# Patient Record
Sex: Male | Born: 1965 | Race: White | Hispanic: No | Marital: Married | Smoking: Current every day smoker
Health system: Southern US, Community
[De-identification: ages and names within clinical notes are randomized; demographics above are authoritative.]

## PROBLEM LIST (undated history)

## (undated) ENCOUNTER — Emergency Department (HOSPITAL_COMMUNITY): Disposition: A | Discharge status: Home / Self Care | Payer: Self-pay

## (undated) DIAGNOSIS — I10 Essential (primary) hypertension: Secondary | ICD-10-CM

## (undated) DIAGNOSIS — E785 Hyperlipidemia, unspecified: Secondary | ICD-10-CM

## (undated) DIAGNOSIS — L408 Other psoriasis: Secondary | ICD-10-CM

## (undated) HISTORY — DX: Essential (primary) hypertension: I10

## (undated) HISTORY — PX: HEMORRHOID SURGERY: SHX153

## (undated) HISTORY — DX: Other psoriasis: L40.8

## (undated) HISTORY — DX: Hyperlipidemia, unspecified: E78.5

---

## 2008-05-19 ENCOUNTER — Emergency Department (HOSPITAL_COMMUNITY): Admission: EM | Admit: 2008-05-19 | Discharge: 2008-05-20 | Payer: Self-pay | Admitting: Emergency Medicine

## 2009-11-19 IMAGING — CT CT NECK W/ CM
1 series · 12 of 14 positions shown, 15 images · IV contrast (agent unspecified)
Comparison: No priors

Addendum Begins

Note that the impression should have included the statement that to
there is also an abscess in the region of the right piriform sinus
SIGNED BY: Kervin Bibiano, M.D.
Addendum Ends
CLINICAL DATA: Sore throat.  Dysphasia.
CT NECK WITH CONTRAST
TECHNIQUE: Multidetector CT imaging of the neck was performed with
intravenous contrast.
Contrast: 100 ml Fmnipaque-8II IV

[Series 2: neck_routine 3.0 b40s st · axial · 0.39mm/px · z∈[-396,-132]mm · 12 of 105 slices shown, 15 images]
[im 9/105  soft-tissue]
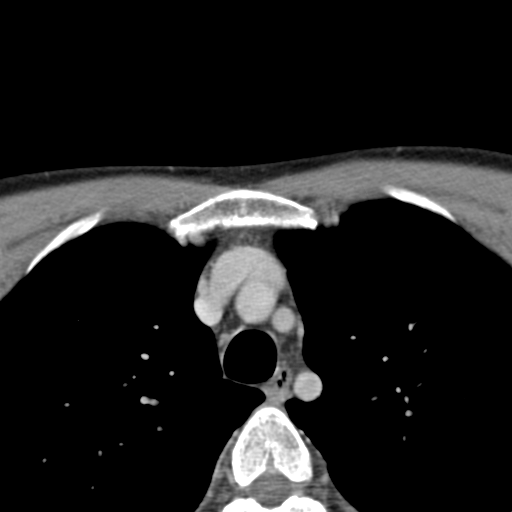
[im 9/105  bone]
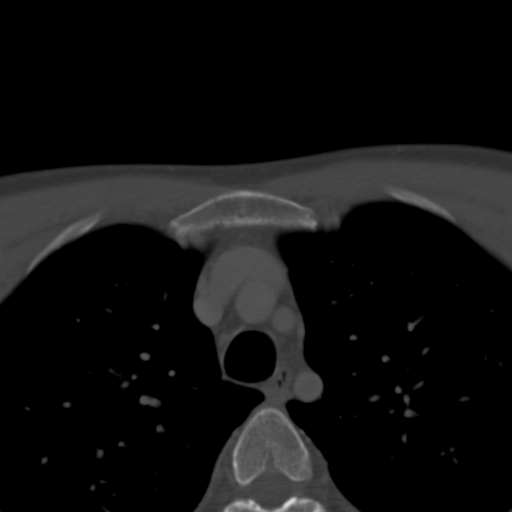
[im 17/105  bone]
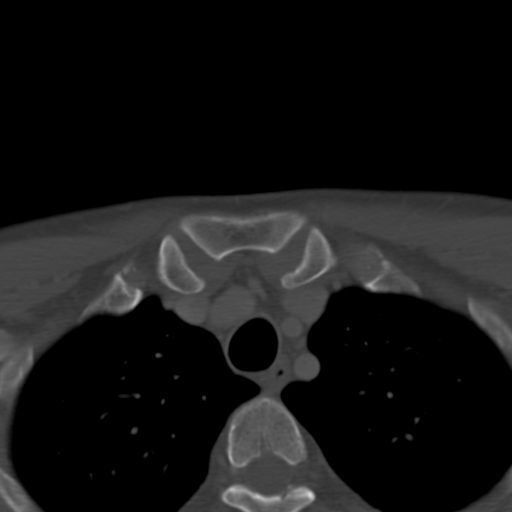
[im 25/105  bone]
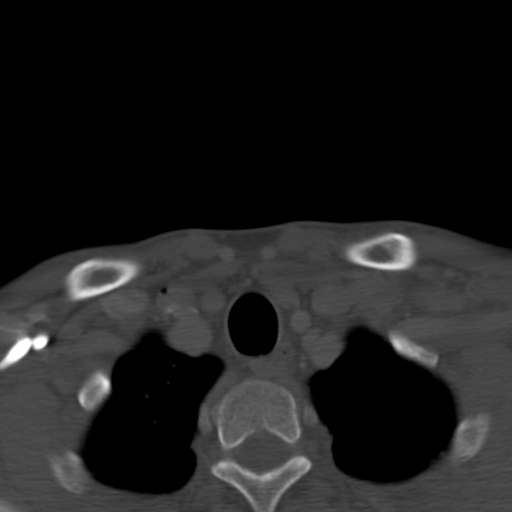
[im 33/105  bone]
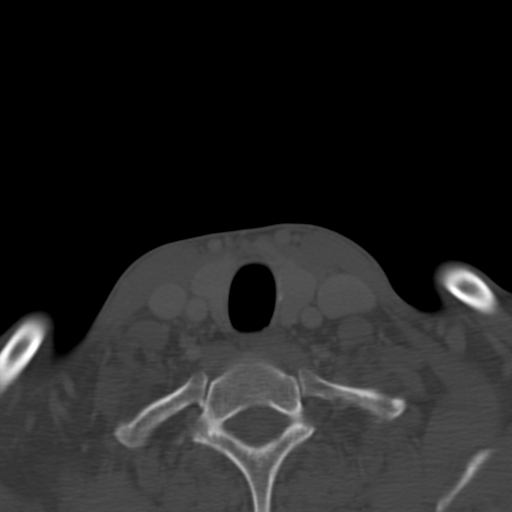
[im 41/105  soft-tissue]
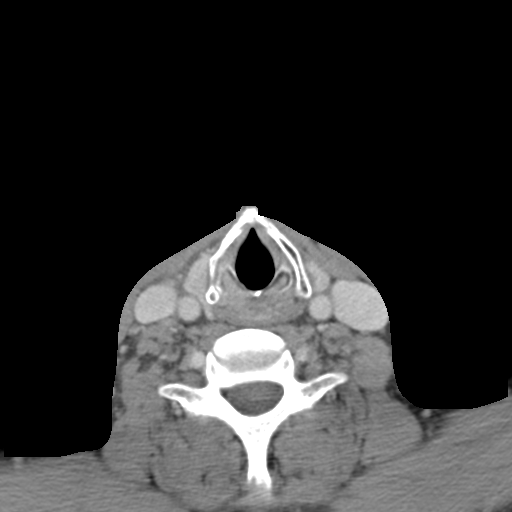
[im 41/105  bone]
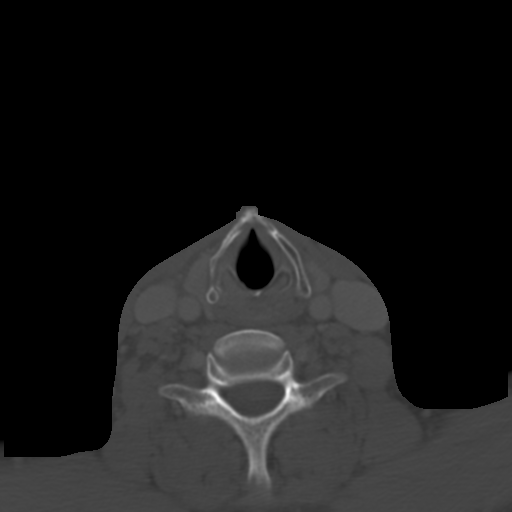
[im 49/105  bone]
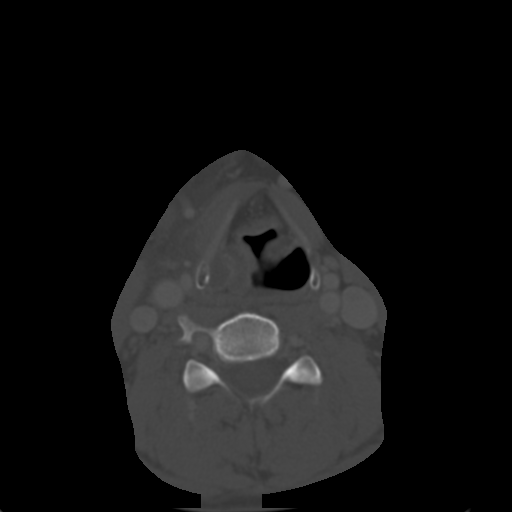
[im 57/105  bone]
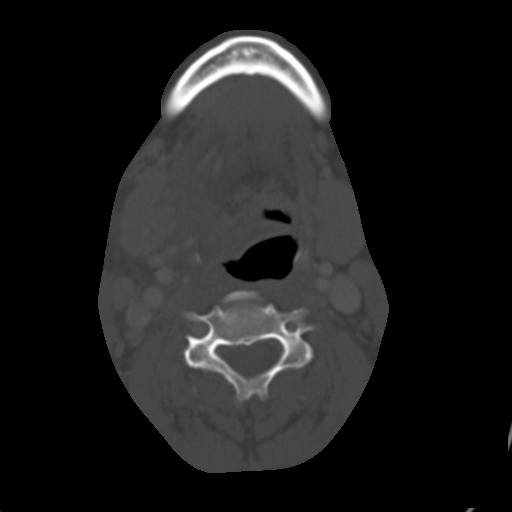
[im 65/105  bone]
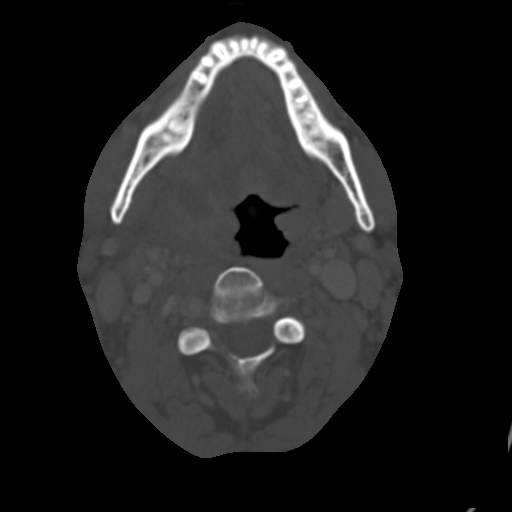
[im 73/105  soft-tissue]
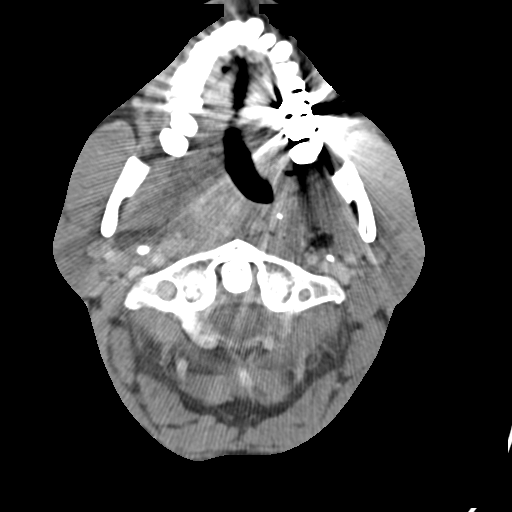
[im 73/105  bone]
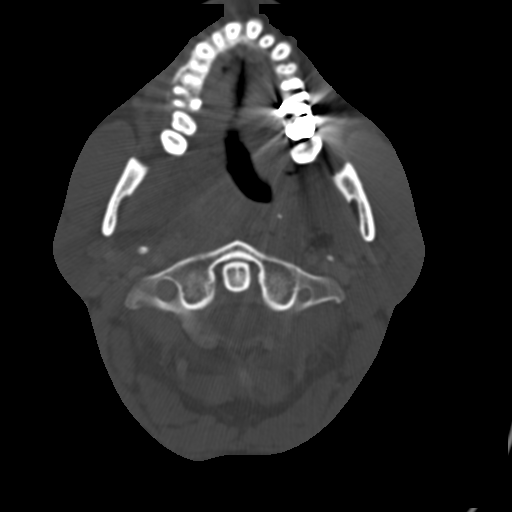
[im 81/105  bone]
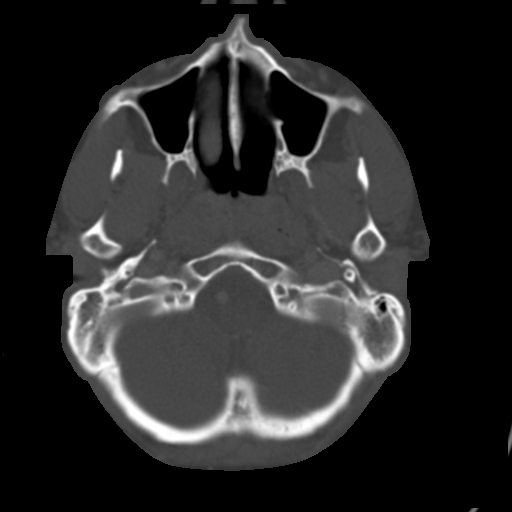
[im 89/105  bone]
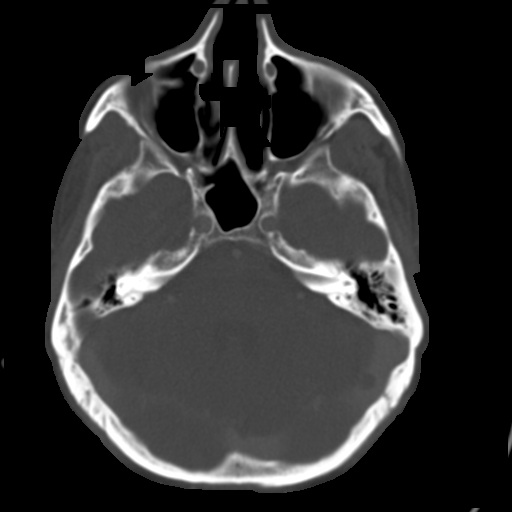
[im 97/105  bone]
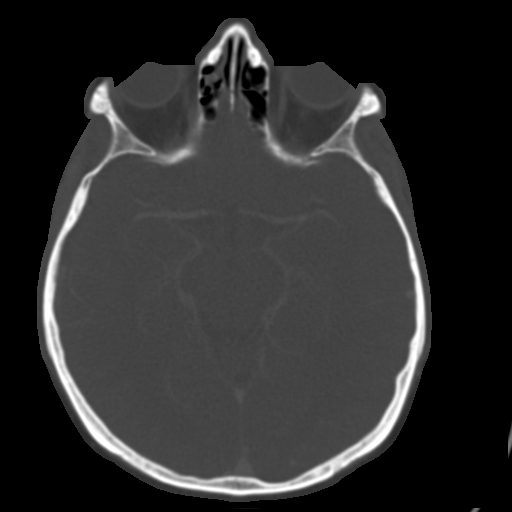

[12 of 14 positions shown; findings below may reference images not displayed]

FINDINGS: Findings compatible with extensive infection involving
the supraglottic neck structures including mainly the right aspect
of the posterior nasopharynx, the uvula, the oropharynx including
the tonsils with findings compatible with a 1.2 x 1.8 by 1.9 right
paratonsillar abscess.  The structures are quite edematous with
phlegmonous involvement.  There is involvement of the soft tissues
posterolateral to the right tongue base and extending into the
epiglottis and preepiglottic space.  Edema also involves the more
posterior soft tissues adjacent to the right carotid and jugular
vessels.  Edema appears to involve the aryepiglottic folds and
particularly about the right piriform sinus with an abscess within
and/or directly contiguous particularly with the lateral aspect of
the right piriform sinus.  The vocal cords do not appear directly
involved.  Thyroid gland unremarkable.  No pathologically enlarged
lymph nodes.
IMPRESSION: Findings compatible severe neck infection with extensive
involvement of the supraglottic structures.  Findings compatible
with pharyngitis and right paratonsillar abscess. Extensive
phlegmonos changes are noted.

Critical test results telephoned to [place], PA in the ER at the time
of interpretation on date 04/18/2008 at time 1513 hours. I stressed
the need for emergent ENT consultation.

## 2011-02-28 NOTE — Op Note (Signed)
NAME:  Todd Nash, Todd Nash NO.:  [id number]   MEDICAL RECORD NO.:  [id number]          PATIENT TYPE:  EMS   LOCATION:  ED                           FACILITY:  [facility]   PHYSICIAN:  Antony Contras, MD     DATE OF BIRTH:  07/04/1966   DATE OF PROCEDURE:  05/19/2008  DATE OF DISCHARGE:                               OPERATIVE REPORT   PREOPERATIVE DIAGNOSIS:  Right peritonsillar abscess.   POSTOPERATIVE DIAGNOSIS:  Right peritonsillar abscess.   PROCEDURE:  Incision and drainage of right peritonsillar abscess.   SURGEON:  Antony Contras, M.D.   ANESTHESIA:  Local.   COMPLICATIONS:  None.   INDICATIONS FOR PROCEDURE:  The patient is a 45 year old white male who  developed sore throat on the right side 2 days ago that has progressed  inspite of antibiotic therapy.  A CT scan and examination demonstrated a  right peritonsillar abscess.   FINDINGS:  The abscess was able to be entered through a peritonsillar  incision and was found to be lateral and low on the right side.  Yellow  pus was drained.   DESCRIPTION OF PROCEDURE:  The patient was identified in the emergency  department and informed consent was obtained including a discussion of  risks, benefits, and alternatives.  The patient was placed in a seated  position and the right oropharynx was sprayed with Cetacaine twice.  One  percent lidocaine with 1:100,000 epinephrine was then injected in the  right oropharynx in the peritonsillar region.  A horizontal incision was  made above the right tonsil using an 11 blade scalpel and dissection was  then performed using Hemostat into the peritonsillar region.  No abscess  was encountered.  A 22 gauge needle on a 10 mL syringe was then used to  try to find the abscess in various places in the right peritonsillar  region.  The skin was reexamined a couple of times during the attempt.  Ultimately the incision was extended laterally and inferiorly along the  tonsillar  border and the abscess was finally encountered inferior and  laterally.  Yellow pus drained readily.  After this, the patient was  returned to emergency room care in stable condition.   RECOMMENDATIONS:  After receiving intravenous fluids, Clindamycin, and  Decadron in the emergency department, he will be discharged home on oral  Clindamycin 300 mg four times daily for 7 days and prednisone 50 mg once  daily for 3 days.  Follow-up will be in my office in 1 week.      Antony Contras, MD  Electronically Signed     DDB/MEDQ  D:  05/19/2008  T:  05/20/2008  Job:  [id number]

## 2011-02-28 NOTE — Consult Note (Signed)
NAME:  Todd Nash, Todd Nash NO.:  [id number]   MEDICAL RECORD NO.:  [id number]          PATIENT TYPE:  EMS   LOCATION:  ED                           FACILITY:  [facility]   PHYSICIAN:  Antony Contras, MD     DATE OF BIRTH:  27-Sep-1966   DATE OF CONSULTATION:  05/19/2008  DATE OF DISCHARGE:                                 CONSULTATION   CHIEF COMPLAINT:  Right peritonsillar abscess.   HISTORY OF PRESENT ILLNESS:  The patient is a 45 year old white male who  developed sore throat 2 days ago that is primarily on the right side.  He went to Urgent Care yesterday where a Strep test was negative and he  was prescribed Omnicef.  It worsened through the day today and has been  associated with a low grade fever of 100.2 degrees.  He has been unable  to swallow due to pain and has a hot potato voice.  The pain is 10/10  and is on the right side and radiates to the right ear.   PAST MEDICAL HISTORY:  None.   MEDICATIONS:  Omnicef.   ALLERGIES:  No known drug allergies.   FAMILY HISTORY:  None.   SOCIAL HISTORY:  The patient smokes a pack of cigarettes per day and has  for 26 years.  He is an occasional drinker and denies drug use.   REVIEW OF SYSTEMS:  Negative except as listed above.   PHYSICAL EXAMINATION:  VITAL SIGNS:  Temperature 99.7, blood pressure  142/91, pulse 100, respirations 22.  GENERAL:  The patient is in no acute distress, but appears  uncomfortable.  He is alert and oriented.  HEENT:  Voice; the patient has a hot potato voice.  Ears; external ears  are normal and external canals are without significant cerumen.  Tympanic membranes are intact.  Middle ear spaces are aerated.  Nose;  external nose is normal.  Nasal passages are patent.  Oral cavity,  oropharynx; the patient has mild trismus.  He has bad breath and dry  mucous membranes.  The lips and teeth are normal as is the tongue and  floor of mouth.  The right oropharynx is significant for marked  peritonsillar edema on the right side pushing the uvula toward the left  with a swollen uvula.  NECK:  The patient is tender in zone 2 on the right.  The remainder of  the neck is without tenderness or deformity.  Cranial nerves II-XII  grossly intact.  Salivary glands normal to palpation.  Thyroid is normal  to palpation.  LYMPHATICS:  There are no enlarged cervical lymph nodes.   RADIOLOGIC EXAMINATION:  A CT of the neck was personally reviewed and  demonstrates a fluid collection in the right peritonsillar region with  surrounding tissue enhancement and edema.   ASSESSMENT:  The patient is a 45 year old white male with a right  peritonsillar abscess.   PLAN:  The abscess will be drained in the emergency department under  local anesthetic.  The patient has been given intravenous Clindamycin  and will be given intravenous Decadron as well.  He should  be able to be  discharged on oral antibiotics and oral steroids.      Antony Contras, MD  Electronically Signed     DDB/MEDQ  D:  05/19/2008  T:  05/20/2008  Job:  [id number]

## 2021-12-19 ENCOUNTER — Telehealth: Payer: Self-pay | Admitting: Internal Medicine

## 2021-12-19 NOTE — Telephone Encounter (Signed)
Patient called states he had a cologuard test done and it came out positive. I offered to schedule him but he requested to speak with you first.  ? ?Michela Pitcher he was a friend. ? ?Thanks ?

## 2021-12-19 NOTE — Telephone Encounter (Signed)
I spoke to this patient.  Acquaintance from the past.  He needs an office appointment with me. ?Please offer him either this Thursday, March 8 at 11:40 AM (would be an add-on); or March 29 at 2 PM. ?Thanks, ?Dr. Henrene Pastor ?

## 2021-12-20 ENCOUNTER — Encounter: Payer: Self-pay | Admitting: Internal Medicine

## 2022-01-30 ENCOUNTER — Ambulatory Visit: Payer: No Typology Code available for payment source | Admitting: Internal Medicine

## 2022-01-30 ENCOUNTER — Encounter: Payer: Self-pay | Admitting: Internal Medicine

## 2022-01-30 VITALS — BP 138/58 | HR 64 | Ht 73.0 in | Wt 150.0 lb

## 2022-01-30 DIAGNOSIS — R195 Other fecal abnormalities: Secondary | ICD-10-CM

## 2022-01-30 MED ORDER — PLENVU 140 G PO SOLR: 1.0000 | Freq: Once | ORAL | 0 refills | Status: AC

## 2022-01-30 NOTE — Patient Instructions (Signed)
If you are age 55 or older, your body mass index should be between 23-30. Your Body mass index is 19.79 kg/m?Marland Kitchen If this is out of the aforementioned range listed, please consider follow up with your Primary Care Provider. ? ?If you are age 45 or younger, your body mass index should be between 19-25. Your Body mass index is 19.79 kg/m?Marland Kitchen If this is out of the aformentioned range listed, please consider follow up with your Primary Care Provider.  ? ?________________________________________________________ ? ?The [facility] providers would like to encourage you to use [facility] to communicate with providers for non-urgent requests or questions.  Due to long hold times on the telephone, sending your provider a message by [facility] may be a faster and more efficient way to get a response.  Please allow 48 business hours for a response.  Please remember that this is for non-urgent requests.  ?_______________________________________________________ ?You have been scheduled for a colonoscopy. Please follow written instructions given to you at your visit today.  ?Please pick up your prep supplies at the pharmacy within the next 1-3 days. ?If you use inhalers (even only as needed), please bring them with you on the day of your procedure. ?  ?

## 2022-01-30 NOTE — Progress Notes (Signed)
HISTORY OF PRESENT ILLNESS: ? ?Todd Nash is a 56 y.o. 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  Patient has not had prior GI evaluation.  His GI review of systems is entirely negative.  His father had a history of GERD.  No family history of GI malignancy.  Records from his PCP Cathlean Marseilles, NP have been reviewed.  Blood work from November 21, 2021 shows a normal comprehensive metabolic panel.  CBC reveals mild anemia with hemoglobin 11.1.  MCV 96.  Cologuard testing positive ? ?REVIEW OF SYSTEMS: ? ?All non-GI ROS negative unless otherwise stated in the HPI. ?Past Medical History:  ?Diagnosis Date  ? Hyperlipidemia   ? Hypertension   ? Psoriasiform seborrheic dermatitis   ? ? ?Past Surgical History:  ?Procedure Laterality Date  ? HEMORRHOID SURGERY    ? ? ?Social History ?Todd Nash  reports that he has been smoking cigarettes. He has never used smokeless tobacco. No history on file for alcohol use and drug use. ? ?family history includes Heart attack in his father; Polycystic kidney disease in his father; Ulcers in his father. ? ?No Known Allergies ? ?  ? ?PHYSICAL EXAMINATION: ?Vital signs: BP (!) 138/58   Pulse 64   Ht '6\' 1"'$  (1.854 m)   Wt 150 lb (68 kg)   BMI 19.79 kg/m?   ?Constitutional: generally well-appearing, no acute distress ?Psychiatric: alert and oriented x3, cooperative ?Eyes: extraocular movements intact, anicteric, conjunctiva pink ?Mouth: oral pharynx moist, no lesions ?Neck: supple no lymphadenopathy ?Cardiovascular: heart regular rate and rhythm, no murmur ?Lungs: clear to auscultation bilaterally ?Abdomen: soft, nontender, nondistended, no obvious ascites, no peritoneal signs, normal bowel sounds, no organomegaly ?Rectal: Deferred until colonoscopy ?Extremities: no clubbing, cyanosis,  or lower extremity edema bilaterally ?Skin: no lesions on visible extremities ?Neuro: No focal deficits.  Cranial nerves intact ? ?ASSESSMENT: ? ?1.  Positive Cologuard testing.  Rule out neoplasia ?2.  Mild normocytic anemia ?3.  General medical problems.  Stable ?4.  Chronic smoker ? ? ?PLAN: ? ?1.  Colonoscopy.The nature of the procedure, as well as the risks, benefits, and alternatives were carefully and thoroughly reviewed with the patient. Ample time for discussion and questions allowed. The patient understood, was satisfied, and agreed to proceed. ?2.  Stop smoking ? ? ? ? ?  ?

## 2022-02-20 ENCOUNTER — Encounter: Payer: Self-pay | Admitting: Internal Medicine

## 2022-02-27 ENCOUNTER — Encounter: Payer: Self-pay | Admitting: Internal Medicine

## 2022-02-27 ENCOUNTER — Ambulatory Visit (AMBULATORY_SURGERY_CENTER): Payer: No Typology Code available for payment source | Admitting: Internal Medicine

## 2022-02-27 VITALS — BP 133/88 | HR 67 | Temp 97.8°F | Resp 13 | Ht 73.0 in | Wt 150.0 lb

## 2022-02-27 DIAGNOSIS — D124 Benign neoplasm of descending colon: Secondary | ICD-10-CM | POA: Diagnosis not present

## 2022-02-27 DIAGNOSIS — D122 Benign neoplasm of ascending colon: Secondary | ICD-10-CM

## 2022-02-27 DIAGNOSIS — D123 Benign neoplasm of transverse colon: Secondary | ICD-10-CM

## 2022-02-27 DIAGNOSIS — D125 Benign neoplasm of sigmoid colon: Secondary | ICD-10-CM | POA: Diagnosis not present

## 2022-02-27 DIAGNOSIS — K648 Other hemorrhoids: Secondary | ICD-10-CM

## 2022-02-27 DIAGNOSIS — R195 Other fecal abnormalities: Secondary | ICD-10-CM

## 2022-02-27 DIAGNOSIS — Z1211 Encounter for screening for malignant neoplasm of colon: Secondary | ICD-10-CM

## 2022-02-27 MED ORDER — SODIUM CHLORIDE 0.9 % IV SOLN: 500.0000 mL | Freq: Once | INTRAVENOUS | Status: DC

## 2022-02-27 NOTE — Op Note (Signed)
[facility] ?Patient Name: Todd Nash ?Procedure Date: 02/27/2022 2:38 PM ?MRN: [id number] ?Endoscopist: Docia Chuck. Henrene Pastor , MD ?Age: 56 ?Referring MD:  ?Date of Birth: 08/26/1966 ?Gender: Male ?Account #: [id number] ?Procedure:                Colonoscopy with cold snare polypectomy x 6 ?Indications:              Screening for colorectal malignant neoplasm.  ?                          Positive Cologuard testing. Index exam ?Medicines:                Monitored Anesthesia Care ?Procedure:                Pre-Anesthesia Assessment: ?                          - Prior to the procedure, a History and Physical  ?                          was performed, and patient medications and  ?                          allergies were reviewed. The patient's tolerance of  ?                          previous anesthesia was also reviewed. The risks  ?                          and benefits of the procedure and the sedation  ?                          options and risks were discussed with the patient.  ?                          All questions were answered, and informed consent  ?                          was obtained. Prior Anticoagulants: The patient has  ?                          taken no previous anticoagulant or antiplatelet  ?                          agents. ASA Grade Assessment: II - A patient with  ?                          mild systemic disease. After reviewing the risks  ?                          and benefits, the patient was deemed in  ?                          satisfactory condition to undergo the procedure. ?  After obtaining informed consent, the colonoscope  ?                          was passed under direct vision. Throughout the  ?                          procedure, the patient's blood pressure, pulse, and  ?                          oxygen saturations were monitored continuously. The  ?                          CF [id number] #[id number] was introduced through the anus  ?                           and advanced to the the cecum, identified by  ?                          appendiceal orifice and ileocecal valve. The  ?                          ileocecal valve, appendiceal orifice, and rectum  ?                          were photographed. The quality of the bowel  ?                          preparation was excellent. The colonoscopy was  ?                          performed without difficulty. The patient tolerated  ?                          the procedure well. The bowel preparation used was  ?                          SUPREP via split dose instruction. ?Scope In: 2:47:06 PM ?Scope Out: 3:09:05 PM ?Scope Withdrawal Time: 0 hours 19 minutes 9 seconds  ?Total Procedure Duration: 0 hours 21 minutes 59 seconds  ?Findings:                 Six polyps were found in the sigmoid colon,  ?                          descending colon, transverse colon and ascending  ?                          colon. The polyps were 2 to 5 mm in size. These  ?                          polyps were removed with a cold snare. Resection  ?                          and retrieval were  complete. ?                          Internal hemorrhoids were found during retroflexion. ?                          The exam was otherwise without abnormality on  ?                          direct and retroflexion views. ?Complications:            No immediate complications. Estimated blood loss:  ?                          None. ?Estimated Blood Loss:     Estimated blood loss: none. ?Impression:               - Six 2 to 5 mm polyps in the sigmoid colon, in the  ?                          descending colon, in the transverse colon and in  ?                          the ascending colon, removed with a cold snare.  ?                          Resected and retrieved. ?                          - Internal hemorrhoids. ?                          - The examination was otherwise normal on direct  ?                          and retroflexion views. ?Recommendation:            - Repeat colonoscopy in 3 years for surveillance. ?                          - Patient has a contact number available for  ?                          emergencies. The signs and symptoms of potential  ?                          delayed complications were discussed with the  ?                          patient. Return to normal activities tomorrow.  ?                          Written discharge instructions were provided to the  ?                          patient. ?                          -  Resume previous diet. ?                          - Continue present medications. ?                          - Await pathology results. ?Docia Chuck. Henrene Pastor, MD ?02/27/2022 3:24:53 PM ?This report has been signed electronically. ?

## 2022-02-27 NOTE — Progress Notes (Signed)
Pt's states no medical or surgical changes since previsit or office visit. 

## 2022-02-27 NOTE — Progress Notes (Signed)
HISTORY OF PRESENT ILLNESS: ?  ?Todd Nash is a 56 y.o. 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  Patient has not had prior GI evaluation.  His GI review of systems is entirely negative.  His father had a history of GERD.  No family history of GI malignancy.  Records from his PCP Cathlean Marseilles, NP have been reviewed.  Blood work from November 21, 2021 shows a normal comprehensive metabolic panel.  CBC reveals mild anemia with hemoglobin 11.1.  MCV 96.  Cologuard testing positive ?  ?REVIEW OF SYSTEMS: ?  ?All non-GI ROS negative unless otherwise stated in the HPI. ?    ?Past Medical History:  ?Diagnosis Date  ? Hyperlipidemia    ? Hypertension    ? Psoriasiform seborrheic dermatitis    ?  ?  ?     ?Past Surgical History:  ?Procedure Laterality Date  ? HEMORRHOID SURGERY      ?  ?  ?Social History ?Todd Nash  reports that he has been smoking cigarettes. He has never used smokeless tobacco. No history on file for alcohol use and drug use. ?  ?family history includes Heart attack in his father; Polycystic kidney disease in his father; Ulcers in his father. ?  ?No Known Allergies ?  ?  ?  ?PHYSICAL EXAMINATION: ?Vital signs: BP (!) 138/58   Pulse 64   Ht '6\' 1"'$  (1.854 m)   Wt 150 lb (68 kg)   BMI 19.79 kg/m?   ?Constitutional: generally well-appearing, no acute distress ?Psychiatric: alert and oriented x3, cooperative ?Eyes: extraocular movements intact, anicteric, conjunctiva pink ?Mouth: oral pharynx moist, no lesions ?Neck: supple no lymphadenopathy ?Cardiovascular: heart regular rate and rhythm, no murmur ?Lungs: clear to auscultation bilaterally ?Abdomen: soft, nontender, nondistended, no obvious ascites, no peritoneal signs, normal bowel sounds, no organomegaly ?Rectal: Deferred until  colonoscopy ?Extremities: no clubbing, cyanosis, or lower extremity edema bilaterally ?Skin: no lesions on visible extremities ?Neuro: No focal deficits.  Cranial nerves intact ?  ?ASSESSMENT: ?  ?1.  Positive Cologuard testing.  Rule out neoplasia ?2.  Mild normocytic anemia ?3.  General medical problems.  Stable ?4.  Chronic smoker ?  ?  ?PLAN: ?  ?1.  Colonoscopy.The nature of the procedure, as well as the risks, benefits, and alternatives were carefully and thoroughly reviewed with the patient. Ample time for discussion and questions allowed. The patient understood, was satisfied, and agreed to proceed. ?2.  Stop smoking ?  ?  ?  ?

## 2022-02-27 NOTE — Progress Notes (Signed)
Report to PACU, RN, vss, BBS= Clear.  

## 2022-02-27 NOTE — Progress Notes (Signed)
Called to room to assist during endoscopic procedure.  Patient ID and intended procedure confirmed with present staff. Received instructions for my participation in the procedure from the performing physician.  

## 2022-02-27 NOTE — Patient Instructions (Signed)
Handout on polyps and hemorrhoids given. ? ?Await pathology results. ? ?YOU HAD AN ENDOSCOPIC PROCEDURE TODAY AT [facility]:   Refer to the procedure report that was given to you for any specific questions about what was found during the examination.  If the procedure report does not answer your questions, please call your gastroenterologist to clarify.  If you requested that your care partner not be given the details of your procedure findings, then the procedure report has been included in a sealed envelope for you to review at your convenience later. ? ?YOU SHOULD EXPECT: Some feelings of bloating in the abdomen. Passage of more gas than usual.  Walking can help get rid of the air that was put into your GI tract during the procedure and reduce the bloating. If you had a lower endoscopy (such as a colonoscopy or flexible sigmoidoscopy) you may notice spotting of blood in your stool or on the toilet paper. If you underwent a bowel prep for your procedure, you may not have a normal bowel movement for a few days. ? ?Please Note:  You might notice some irritation and congestion in your nose or some drainage.  This is from the oxygen used during your procedure.  There is no need for concern and it should clear up in a day or so. ? ?SYMPTOMS TO REPORT IMMEDIATELY: ? ?Following lower endoscopy (colonoscopy or flexible sigmoidoscopy): ? Excessive amounts of blood in the stool ? Significant tenderness or worsening of abdominal pains ? Swelling of the abdomen that is new, acute ? Fever of 100?F or higher ? ?For urgent or emergent issues, a gastroenterologist can be reached at any hour by calling [phone]. ?Do not use MyChart messaging for urgent concerns.  ? ? ?DIET:  We do recommend a small meal at first, but then you may proceed to your regular diet.  Drink plenty of fluids but you should avoid alcoholic beverages for 24 hours. ? ?ACTIVITY:  You should plan to take it easy for the rest of today and  you should NOT DRIVE or use heavy machinery until tomorrow (because of the sedation medicines used during the test).   ? ?FOLLOW UP: ?Our staff will call the number listed on your records 48-72 hours following your procedure to check on you and address any questions or concerns that you may have regarding the information given to you following your procedure. If we do not reach you, we will leave a message.  We will attempt to reach you two times.  During this call, we will ask if you have developed any symptoms of COVID 19. If you develop any symptoms (ie: fever, flu-like symptoms, shortness of breath, cough etc.) before then, please call [phone].  If you test positive for Covid 19 in the 2 weeks post procedure, please call and report this information to [place].   ? ?If any biopsies were taken you will be contacted by phone or by letter within the next 1-3 weeks.  Please call us at [phone] if you have not heard about the biopsies in 3 weeks.  ? ? ?SIGNATURES/CONFIDENTIALITY: ?You and/or your care partner have signed paperwork which will be entered into your electronic medical record.  These signatures attest to the fact that that the information above on your After Visit Summary has been reviewed and is understood.  Full responsibility of the confidentiality of this discharge information lies with you and/or your care-partner.  ?

## 2022-03-01 ENCOUNTER — Telehealth: Payer: Self-pay

## 2022-03-01 NOTE — Telephone Encounter (Signed)
Opened in error

## 2022-03-01 NOTE — Telephone Encounter (Signed)
?  Follow up Call- ? ? ?  02/27/2022  ?  1:59 PM  ?Call back number  ?Post procedure Call Back phone  # [phone]  ?Permission to leave phone message Yes  ?  ? ?Patient questions: ? ?Do you have a fever, pain , or abdominal swelling? No. ?Pain Score  0 * ?  ? ?Have you tolerated food without any problems? Yes.   ? ?Have you been able to return to your normal activities? Yes.   ? ?Do you have any questions about your discharge instructions: ?Diet   No. ?Medications  No. ?Follow up visit  No. ? ?Do you have questions or concerns about your Care? No. ? ?Actions: ?* If pain score is 4 or above: ?No action needed, pain <4. ? ? ?

## 2022-03-02 ENCOUNTER — Encounter: Payer: Self-pay | Admitting: Internal Medicine

## 2024-11-24 ENCOUNTER — Ambulatory Visit: Admitting: Family Medicine
